# Patient Record
Sex: Female | Born: 1975 | Hispanic: Yes | Marital: Married | Smoking: Never smoker
Health system: Southern US, Community
[De-identification: ages and names within clinical notes are randomized; demographics above are authoritative.]

## PROBLEM LIST (undated history)

## (undated) DIAGNOSIS — N83209 Unspecified ovarian cyst, unspecified side: Secondary | ICD-10-CM

## (undated) DIAGNOSIS — J45909 Unspecified asthma, uncomplicated: Secondary | ICD-10-CM

## (undated) DIAGNOSIS — E785 Hyperlipidemia, unspecified: Secondary | ICD-10-CM

## (undated) DIAGNOSIS — T7840XA Allergy, unspecified, initial encounter: Secondary | ICD-10-CM

## (undated) HISTORY — PX: BREAST SURGERY: SHX581

## (undated) HISTORY — DX: Hyperlipidemia, unspecified: E78.5

## (undated) HISTORY — PX: BREAST EXCISIONAL BIOPSY: SUR124

## (undated) HISTORY — DX: Allergy, unspecified, initial encounter: T78.40XA

## (undated) HISTORY — DX: Unspecified asthma, uncomplicated: J45.909

---

## 2008-03-26 HISTORY — PX: ABDOMINAL HYSTERECTOMY: SHX81

## 2020-08-01 ENCOUNTER — Ambulatory Visit: Payer: Self-pay | Admitting: Internal Medicine

## 2020-08-16 ENCOUNTER — Telehealth: Payer: Self-pay | Admitting: Family Medicine

## 2020-08-16 NOTE — Telephone Encounter (Signed)
Interpreter Raquel called patient to tell about appointment change, she left a message and asked the patient to call back

## 2020-08-17 ENCOUNTER — Encounter: Payer: Self-pay | Admitting: Family Medicine

## 2020-08-18 ENCOUNTER — Emergency Department (HOSPITAL_COMMUNITY)
Admission: EM | Admit: 2020-08-18 | Discharge: 2020-08-18 | Disposition: A | Discharge status: Home / Self Care | Payer: Self-pay | Attending: Emergency Medicine | Admitting: Emergency Medicine

## 2020-08-18 ENCOUNTER — Emergency Department (HOSPITAL_COMMUNITY): Payer: Self-pay

## 2020-08-18 ENCOUNTER — Encounter (HOSPITAL_COMMUNITY): Payer: Self-pay | Admitting: Emergency Medicine

## 2020-08-18 DIAGNOSIS — N83209 Unspecified ovarian cyst, unspecified side: Secondary | ICD-10-CM

## 2020-08-18 DIAGNOSIS — N83202 Unspecified ovarian cyst, left side: Secondary | ICD-10-CM | POA: Insufficient documentation

## 2020-08-18 HISTORY — DX: Unspecified ovarian cyst, unspecified side: N83.209

## 2020-08-18 MED ORDER — NORGESTIMATE-ETH ESTRADIOL 0.25-35 MG-MCG PO TABS: 1.0000 | ORAL_TABLET | Freq: Every day | ORAL | 1 refills | Status: DC

## 2020-08-18 NOTE — ED Provider Notes (Signed)
[facility] EMERGENCY DEPARTMENT Provider Note   CSN: [id number] Arrival date & time: 08/18/20  1023     History No chief complaint on file.   [name] is a 45 y.o. female.  HPI    45 year old female comes in a chief complaint of ovarian cyst. Patient is new to the country.  When she was in [place], she was noted to have ovarian cyst.  She was started on birth control pill.  She was advised to follow-up with a gynecologist here.  She had an appointment on 5-25 which fell through for some reason.  She comes to the ER because she is running out of her oral contraceptives and really needs to know if her cyst is responding to the OCPs she was taking.  She has mild discomfort but currently is comfortable and denies any bleeding.  Translation service was utilized for this visit. Past Medical History:  Diagnosis Date  . Ovarian cyst     There are no problems to display for this patient.    OB History   No obstetric history on file.     No family history on file.  Social History   Tobacco Use  . Smoking status: Never Smoker  Substance Use Topics  . Alcohol use: Not Currently  . Drug use: Never    Home Medications Prior to Admission medications   Medication Sig Start Date End Date Taking? Authorizing Provider  CRANBERRY PO Take 2 tablets by mouth daily.   Yes [provider]  esomeprazole (NEXIUM) 20 MG capsule Take 20 mg by mouth daily.   Yes [provider]  fosfomycin (MONUROL) 3 g PACK Take 3 g by mouth every 3 (three) days. 3 packs of 3g each.   Yes [provider]  Probiotic Product (PROBIOTIC PO) Take 1 capsule by mouth daily.   Yes [provider]  sertraline (ZOLOFT) 50 MG tablet Take 50 mg by mouth daily.   Yes [provider]  norgestimate-ethinyl estradiol (ORTHO-CYCLEN, 28,) 0.25-35 MG-MCG tablet Take 1 tablet by mouth daily. 08/18/20  Yes Derwood Kaplan, MD     Allergies    Epinephrine and Orphenadrine  Review of Systems   Review of Systems  Constitutional: Negative for activity change.  Gastrointestinal: Negative for abdominal pain.  Genitourinary: Positive for pelvic pain. Negative for vaginal bleeding and vaginal discharge.    Physical Exam Updated Vital Signs BP 126/79   Pulse 81   Temp 98.1 F (36.7 C) (Oral)   Resp 16   SpO2 100%   Physical Exam Vitals and nursing note reviewed.  Constitutional:      Appearance: She is well-developed.  HENT:     Head: Normocephalic and atraumatic.  Cardiovascular:     Rate and Rhythm: Normal rate.  Pulmonary:     Effort: Pulmonary effort is normal.  Abdominal:     General: Bowel sounds are normal.  Musculoskeletal:     Cervical back: Normal range of motion and neck supple.  Skin:    General: Skin is warm and dry.  Neurological:     Mental Status: She is alert and oriented to person, place, and time.     ED Results / Procedures / Treatments   Labs (all labs ordered are listed, but only abnormal results are displayed) Labs Reviewed - No data to display  EKG None  Radiology US PELVIC COMPLETE W TRANSVAGINAL AND TORSION R/O  Result Date: 08/18/2020 CLINICAL DATA:  Pelvic pain.  Left ovarian cystic lesion. EXAM: TRANSABDOMINAL AND TRANSVAGINAL ULTRASOUND OF PELVIS DOPPLER ULTRASOUND OF OVARIES TECHNIQUE: Both transabdominal and transvaginal ultrasound examinations of the pelvis were performed. Transabdominal technique was performed for global imaging of the pelvis including uterus, ovaries, adnexal regions, and pelvic cul-de-sac. It was necessary to proceed with endovaginal exam following the transabdominal exam to visualize the ovaries. Color and duplex Doppler ultrasound was utilized to evaluate blood flow to the ovaries. COMPARISON:  None. FINDINGS: Uterus Measurements: Surgically absent. Vaginal cuff is unremarkable in appearance. Endometrium Thickness: Surgically absent. Right  ovary Measurements: Not visualized, however no adnexal mass identified. Left ovary Measurements: 6.9 x 5.4 x 5.4 cm = volume: 107 mL. No normal left ovary is seen. A cystic lesion is seen in the left adnexa which shows a single smooth but thickened septation measuring approximately 5 mm in thickness. This lesion measures 5.8 x 5.0 x 5.1 cm. Pulsed Doppler evaluation of right ovary could not be performed because it was not visualized. Pulsed Doppler evaluation of the left adnexal lesion demonstrates arterial and venous waveforms within the peripheral rim and internal septation of this lesion, which is presumably ovarian in origin. Other findings No abnormal free fluid. IMPRESSION: 5.8 cm complex cystic lesion in left adnexa, with indeterminate but probably benign characteristics. Cystic ovarian neoplasm cannot be excluded. Consider correlation with tumor markers, and short-term follow-up with ultrasound or pelvic MRI without and with contrast in 6 weeks. Blood flow is seen within this left adnexal lesion, which is presumably ovarian in origin. Nonvisualization of right ovary, however no right adnexal mass identified. Prior hysterectomy. Electronically Signed   By: Danae Orleans M.D.   On: 08/18/2020 14:21    Procedures Procedures   Medications Ordered in ED Medications - No data to display  ED Course  I have reviewed the triage vital signs and the nursing notes.  Pertinent labs & imaging results that were available during my care of the patient were reviewed by me and considered in my medical decision making (see chart for details).    MDM Rules/Calculators/A&P                          Patient comes in with chief complaint of ovarian cyst.  It appears that something happened with an appointment yesterday, and patient is running out of her OCPs that were prescribed for her ovarian cyst.  She is not having any current pain or bleeding.  She needs to know if she needs to continue taking OCPs and she  needs prescription for it.  Ultrasound ordered.  She does have a cyst that is about 5 cm big.  Slightly lower than the cyst size at the very beginning.  Spoke with faculty on-call.  They recommended continuation of OCPs -and as per Dr. Crissie Reese, Ortho-Cyclen has been prescribed.  Results discussed with the patient.  Confirmed pharmacy location and also next appointment on July 6.  Final Clinical Impression(s) / ED Diagnoses Final diagnoses:  Cyst of left ovary    Rx / DC Orders ED Discharge Orders         Ordered    norgestimate-ethinyl estradiol (ORTHO-CYCLEN, 28,) 0.25-35 MG-MCG tablet  Daily        08/18/20 1448           Derwood Kaplan, MD 08/18/20 1457

## 2020-08-18 NOTE — ED Triage Notes (Signed)
Pt reports she has left ovarian cyst. Pt reports has new patient appt in Oct. Requesting ultrasound. Denies pain, nausea, vomiting, fever.

## 2020-08-18 NOTE — ED Notes (Signed)
Pt discharged by EDP. Pt verbalizes understanding of d/c instructions. Prescriptions reviewed with patient. Pt ambulatory at d/c with all belongings and with family.

## 2020-08-18 NOTE — Progress Notes (Signed)
Spoke with Dr. Rhunette Croft regarding this patient, hx of ovarian mass and placed on OCP's. Was to have visit yesterday with [facility] but this was rescheduled to July. OK to wait until this visit for evaluation by OBGYN, recommend continued treatment with OCP's until that visit.

## 2020-08-18 NOTE — Discharge Instructions (Addendum)
Your cyst on the left side measures: 5.8 x 5.0 x 5.1 cm.   Take the medicine prescribed. You have an [facility] in [facility] for [facility].

## 2020-08-18 NOTE — ED Notes (Signed)
Patient transported to Ultrasound 

## 2020-08-18 NOTE — ED Notes (Signed)
Will update pt vitals upon return from ultrasound

## 2020-09-28 ENCOUNTER — Encounter: Payer: Self-pay | Admitting: Obstetrics & Gynecology

## 2020-12-26 ENCOUNTER — Other Ambulatory Visit: Payer: Self-pay | Admitting: Obstetrics and Gynecology

## 2020-12-26 ENCOUNTER — Other Ambulatory Visit: Payer: Self-pay

## 2020-12-26 DIAGNOSIS — Z1231 Encounter for screening mammogram for malignant neoplasm of breast: Secondary | ICD-10-CM

## 2021-01-24 ENCOUNTER — Ambulatory Visit: Payer: Self-pay

## 2021-02-21 ENCOUNTER — Ambulatory Visit: Payer: Self-pay

## 2021-02-23 ENCOUNTER — Other Ambulatory Visit: Payer: Self-pay

## 2021-02-23 ENCOUNTER — Ambulatory Visit: Payer: Self-pay | Admitting: *Deleted

## 2021-02-23 ENCOUNTER — Encounter (INDEPENDENT_AMBULATORY_CARE_PROVIDER_SITE_OTHER): Payer: Self-pay

## 2021-02-23 ENCOUNTER — Ambulatory Visit
Admission: RE | Admit: 2021-02-23 | Discharge: 2021-02-23 | Disposition: A | Discharge status: Home / Self Care | Payer: No Typology Code available for payment source | Source: Ambulatory Visit | Attending: Obstetrics and Gynecology | Admitting: Obstetrics and Gynecology

## 2021-02-23 VITALS — BP 118/72 | Wt 196.4 lb

## 2021-02-23 DIAGNOSIS — Z1239 Encounter for other screening for malignant neoplasm of breast: Secondary | ICD-10-CM

## 2021-02-23 DIAGNOSIS — Z1211 Encounter for screening for malignant neoplasm of colon: Secondary | ICD-10-CM

## 2021-02-23 DIAGNOSIS — Z1231 Encounter for screening mammogram for malignant neoplasm of breast: Secondary | ICD-10-CM

## 2021-02-23 NOTE — Patient Instructions (Signed)
Explained breast self awareness with Kristen Crawford. Patient did not need a Pap smear today due to patient has a history of a hysterectomy for benign reasons. Let patient know that she doesn't need any further Pap smears due to her history of a hysterectomy for benign reasons. Referred patient to the [facility] for a screening mammogram on mobile unit. Appointment scheduled Thursday, February 23, 2021 at 1000. Patient escorted to the mobile unit following BCCCP appointment for her screening mammogram. Let patient know the Breast Center will follow up with her within the next couple weeks with results of her mammogram by letter or phone. [name] [name] verbalized understanding.  [name], [name], RN 10:44 AM

## 2021-02-23 NOTE — Progress Notes (Signed)
Kristen Crawford Kristen Crawford is a 45 y.o. female who presents to [facility] clinic today with complaint of left ovary cysts. Let patient know that BCCCP doesn't cover follow-up for ovarian cysts. Patient requested a referral to an OBGYN for follow up. Sent referral to the [facility] for [facility]. Patient given the [facility] Assistance Application.    Pap Smear: Pap smear not completed today. Last Pap smear was in 2021 at a clinic in [place] and was normal per patient. Per patient has no history of an abnormal Pap smear. Patient has a history of a hysterectomy in 2012 due to fibroids. Patient states she only has her ovaries. Patient doesn't need any further Pap smears due to her history of a hysterectomy for benign reasons per BCCCP and ASCCP guidelines. Last Pap smear result is not available in Epic.   Physical exam: Breasts Breasts symmetrical. No skin abnormalities right breast. Observed two scars on left breast at the upper inner and above nipple. 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 No nipple retraction bilateral breasts. No nipple discharge bilateral breasts. No lymphadenopathy. No lumps palpated bilateral breasts. No complaints of pain or tenderness on exam.      Pelvic/Bimanual Pap is not indicated today per BCCCP guidelines.   Smoking History: Patient has never smoked.   Patient Navigation: Patient education provided. Access to services provided for patient through [place] program. Spanish interpreter Natale Lay from [facility] provided. Patient has food insecurities. Patient escorted to the food market at the [employer] for groceries.  Colorectal Cancer Screening: Per patient has never had colonoscopy completed. FIT Test given to patient to complete. No complaints today.    Breast and Cervical Cancer Risk Assessment: Patient does not have family history of breast cancer, known genetic  mutations, or radiation treatment to the chest before age 29. Patient does not have history of cervical dysplasia, immunocompromised, or DES exposure in-utero.  Risk Assessment     Risk Scores       02/23/2021   Last edited by: Narda Rutherford, LPN   5-year risk: 1.7 %   Lifetime risk: 11.8 %            A: BCCCP exam without pap smear No breast complaints.  P: Referred patient to the [facility] for a screening mammogram on mobile unit. Appointment scheduled Thursday, February 23, 2021 at 1000.  Priscille Heidelberg, RN 02/23/2021 10:43 AM

## 2021-02-27 ENCOUNTER — Other Ambulatory Visit: Payer: Self-pay | Admitting: Obstetrics and Gynecology

## 2021-02-27 DIAGNOSIS — R928 Other abnormal and inconclusive findings on diagnostic imaging of breast: Secondary | ICD-10-CM

## 2021-03-04 LAB — FECAL OCCULT BLOOD, IMMUNOCHEMICAL: Fecal Occult Bld: NEGATIVE

## 2021-03-06 ENCOUNTER — Telehealth: Payer: Self-pay

## 2021-03-06 NOTE — Telephone Encounter (Signed)
Via Delorise Royals, Spanish Interpreter, Patient informed negative FIT Test results. Patient verbalized understanding.

## 2021-04-04 ENCOUNTER — Ambulatory Visit
Admission: RE | Admit: 2021-04-04 | Discharge: 2021-04-04 | Disposition: A | Discharge status: Home / Self Care | Payer: No Typology Code available for payment source | Source: Ambulatory Visit | Attending: Obstetrics and Gynecology | Admitting: Obstetrics and Gynecology

## 2021-04-04 ENCOUNTER — Other Ambulatory Visit: Payer: Self-pay | Admitting: Obstetrics and Gynecology

## 2021-04-04 DIAGNOSIS — R928 Other abnormal and inconclusive findings on diagnostic imaging of breast: Secondary | ICD-10-CM

## 2021-05-17 ENCOUNTER — Encounter: Payer: Self-pay | Admitting: Obstetrics & Gynecology

## 2021-05-17 ENCOUNTER — Other Ambulatory Visit: Payer: Self-pay

## 2021-05-17 ENCOUNTER — Ambulatory Visit (INDEPENDENT_AMBULATORY_CARE_PROVIDER_SITE_OTHER): Payer: Self-pay | Admitting: Obstetrics & Gynecology

## 2021-05-17 VITALS — BP 122/81 | HR 78 | Wt 196.6 lb

## 2021-05-17 DIAGNOSIS — N83202 Unspecified ovarian cyst, left side: Secondary | ICD-10-CM

## 2021-05-17 MED ORDER — FLUCONAZOLE 150 MG PO TABS: 150.0000 mg | ORAL_TABLET | Freq: Once | ORAL | 0 refills | Status: AC

## 2021-05-17 NOTE — Progress Notes (Signed)
Patient ID: Kristen Crawford, female   DOB: 05-11-1975, 46 y.o.   MRN: [id number]  Chief Complaint  Patient presents with   Ovarian Cyst    HPI Kristen Crawford is a 46 y.o. female.  No obstetric history on file. No LMP recorded. Patient has had a hysterectomy. TAH done 10 years ago. She says she had endometriosis. She comes today with h/o left ovarian cyst that has been seen on [place]. Her last [place] was done recently in [place] and she says the mass had reduced. No c/o pain. She stopped OcP that were prescribed due to the cyst. HPI  Past Medical History:  Diagnosis Date   Allergy    Asthma    Hyperlipidemia    Ovarian cyst     Past Surgical History:  Procedure Laterality Date   ABDOMINAL HYSTERECTOMY  2010   BREAST EXCISIONAL BIOPSY Left    fibroadenoma ( years after first BX)   BREAST EXCISIONAL BIOPSY Left    in her 73s - fibroadenoma   BREAST SURGERY      Family History  Problem Relation Age of Onset   Hypertension Father    Testicular cancer Brother     Social History Social History   Tobacco Use   Smoking status: Never   Smokeless tobacco: Never  Vaping Use   Vaping Use: Never used  Substance Use Topics   Alcohol use: Not Currently   Drug use: Never    Allergies  Allergen Reactions   Epinephrine Anaphylaxis    Pt passes out   Orphenadrine Anaphylaxis    Pt passes out    Current Outpatient Medications  Medication Sig Dispense Refill   atorvastatin (LIPITOR) 10 MG tablet Take 10 mg by mouth daily.     CRANBERRY PO Take 2 tablets by mouth daily.     esomeprazole (NEXIUM) 20 MG capsule Take 20 mg by mouth daily.     fluconazole (DIFLUCAN) 150 MG tablet Take 1 tablet (150 mg total) by mouth once for 1 dose. 1 tablet 0   fosfomycin (MONUROL) 3 g PACK Take 3 g by mouth every 3 (three) days. 3 packs of 3g each.     norgestimate-ethinyl estradiol (ORTHO-CYCLEN, 28,) 0.25-35 MG-MCG tablet Take 1 tablet by mouth daily. 60 tablet 1    Probiotic Product (PROBIOTIC PO) Take 1 capsule by mouth daily.     sertraline (ZOLOFT) 50 MG tablet Take 50 mg by mouth daily.     No current facility-administered medications for this visit.    Review of Systems Review of Systems  Constitutional: Negative.   Respiratory: Negative.    Gastrointestinal: Negative.   Genitourinary:  Positive for frequency. Negative for pelvic pain and vaginal bleeding. Vaginal discharge: h/o vaginal yeast no sx today.  Blood pressure 122/81, pulse 78, weight 196 lb 9.6 oz (89.2 kg).  Physical Exam Physical Exam Vitals and nursing note reviewed. Exam conducted with a chaperone present.  Constitutional:      Appearance: Normal appearance.  Genitourinary:    General: Normal vulva.     Exam position: Lithotomy position.     Vagina: Vaginal discharge: mild white d/c not c/w yeast, cuff intact.     Uterus: Absent.      Adnexa: Right adnexa normal and left adnexa normal.  Neurological:     General: No focal deficit present.     Mental Status: She is alert.  Psychiatric:        Mood and Affect: Mood normal.  Behavior: Behavior normal.    Data Reviewed CLINICAL DATA:  Pelvic pain.  Left ovarian cystic lesion.   EXAM: TRANSABDOMINAL AND TRANSVAGINAL ULTRASOUND OF PELVIS   DOPPLER ULTRASOUND OF OVARIES   TECHNIQUE: Both transabdominal and transvaginal ultrasound examinations of the pelvis were performed. Transabdominal technique was performed for global imaging of the pelvis including uterus, ovaries, adnexal regions, and pelvic cul-de-sac.   It was necessary to proceed with endovaginal exam following the transabdominal exam to visualize the ovaries. Color and duplex Doppler ultrasound was utilized to evaluate blood flow to the ovaries.   COMPARISON:  None.   FINDINGS: Uterus   Measurements: Surgically absent. Vaginal cuff is unremarkable in appearance.   Endometrium   Thickness: Surgically absent.   Right ovary    Measurements: Not visualized, however no adnexal mass identified.   Left ovary   Measurements: 6.9 x 5.4 x 5.4 cm = volume: 107 mL. No normal left ovary is seen. A cystic lesion is seen in the left adnexa which shows a single smooth but thickened septation measuring approximately 5 mm in thickness. This lesion measures 5.8 x 5.0 x 5.1 cm.   Pulsed Doppler evaluation of right ovary could not be performed because it was not visualized. Pulsed Doppler evaluation of the left adnexal lesion demonstrates arterial and venous waveforms within the peripheral rim and internal septation of this lesion, which is presumably ovarian in origin.   Other findings   No abnormal free fluid.   IMPRESSION: 5.8 cm complex cystic lesion in left adnexa, with indeterminate but probably benign characteristics. Cystic ovarian neoplasm cannot be excluded. Consider correlation with tumor markers, and short-term follow-up with ultrasound or pelvic MRI without and with contrast in 6 weeks.   Blood flow is seen within this left adnexal lesion, which is presumably ovarian in origin.   Nonvisualization of right ovary, however no right adnexal mass identified.   Prior hysterectomy.     Electronically Signed   By: Danae Orleans M.D.   On: 08/18/2020 14:21  Assessment Cyst of left ovary - Plan: US PELVIC COMPLETE WITH TRANSVAGINAL, fluconazole (DIFLUCAN) 150 MG tablet   Plan She requested diflucan Rx. F/u after [place]     Scheryl Darter 05/17/2021, 9:48 AM

## 2021-05-22 ENCOUNTER — Telehealth: Payer: Self-pay

## 2021-05-23 ENCOUNTER — Ambulatory Visit: Admission: RE | Admit: 2021-05-23 | Payer: No Typology Code available for payment source | Source: Ambulatory Visit

## 2021-05-23 ENCOUNTER — Other Ambulatory Visit: Payer: Self-pay

## 2021-05-23 ENCOUNTER — Ambulatory Visit
Admission: RE | Admit: 2021-05-23 | Discharge: 2021-05-23 | Disposition: A | Discharge status: Home / Self Care | Payer: No Typology Code available for payment source | Source: Ambulatory Visit | Attending: Obstetrics & Gynecology | Admitting: Obstetrics & Gynecology

## 2021-05-23 DIAGNOSIS — N83202 Unspecified ovarian cyst, left side: Secondary | ICD-10-CM | POA: Insufficient documentation

## 2021-05-30 ENCOUNTER — Telehealth: Payer: Self-pay | Admitting: General Practice

## 2021-05-30 NOTE — Telephone Encounter (Signed)
Patient called into front office requesting a call back regarding her ultrasound results. Patient states she has been calling for days and no one has called her back.  ? ?Reviewed results with Dr Debroah Loop who states recommend office f/u as this may be endometriosis and she could be a candidate to have the ovary removed. Called patient with Raquel assisting with spanish interpretation and reviewed results and recommendations with patient. Informed patient of follow up appt scheduled 3/30. Patient verbalized understanding and asked if she could get a copy of the report. Told patient she is welcome to come get a copy of the report anytime we are open or she can access the report in mychart account. Patient verbalized understanding to all. ?

## 2021-06-22 ENCOUNTER — Ambulatory Visit: Payer: No Typology Code available for payment source | Admitting: Obstetrics & Gynecology

## 2021-10-05 ENCOUNTER — Ambulatory Visit
Admission: RE | Admit: 2021-10-05 | Discharge: 2021-10-05 | Disposition: A | Discharge status: Home / Self Care | Payer: No Typology Code available for payment source | Source: Ambulatory Visit | Attending: Obstetrics and Gynecology | Admitting: Obstetrics and Gynecology

## 2021-10-05 DIAGNOSIS — R928 Other abnormal and inconclusive findings on diagnostic imaging of breast: Secondary | ICD-10-CM

## 2022-02-06 ENCOUNTER — Other Ambulatory Visit (HOSPITAL_BASED_OUTPATIENT_CLINIC_OR_DEPARTMENT_OTHER): Payer: Self-pay

## 2022-02-06 ENCOUNTER — Other Ambulatory Visit: Payer: Self-pay

## 2022-02-06 MED ORDER — SAXENDA 18 MG/3ML ~~LOC~~ SOPN
PEN_INJECTOR | SUBCUTANEOUS | 11 refills | Status: AC
  Filled 2022-02-06: qty 15, 30d supply, fill #0
  Filled 2022-02-06: qty 15, fill #0

## 2022-02-14 ENCOUNTER — Other Ambulatory Visit (HOSPITAL_BASED_OUTPATIENT_CLINIC_OR_DEPARTMENT_OTHER): Payer: Self-pay

## 2022-02-14 ENCOUNTER — Other Ambulatory Visit: Payer: Self-pay

## 2022-02-14 MED ORDER — OZEMPIC (0.25 OR 0.5 MG/DOSE) 2 MG/3ML ~~LOC~~ SOPN: PEN_INJECTOR | SUBCUTANEOUS | 2 refills | Status: AC

## 2022-03-08 ENCOUNTER — Other Ambulatory Visit: Payer: Self-pay

## 2022-03-08 DIAGNOSIS — N63 Unspecified lump in unspecified breast: Secondary | ICD-10-CM

## 2022-04-18 ENCOUNTER — Other Ambulatory Visit: Payer: Self-pay | Admitting: Obstetrics and Gynecology

## 2022-04-18 ENCOUNTER — Ambulatory Visit
Admission: RE | Admit: 2022-04-18 | Discharge: 2022-04-18 | Disposition: A | Discharge status: Home / Self Care | Payer: No Typology Code available for payment source | Source: Ambulatory Visit | Attending: Obstetrics and Gynecology | Admitting: Obstetrics and Gynecology

## 2022-04-18 ENCOUNTER — Encounter: Payer: Self-pay | Admitting: Obstetrics and Gynecology

## 2022-04-18 DIAGNOSIS — R7612 Nonspecific reaction to cell mediated immunity measurement of gamma interferon antigen response without active tuberculosis: Secondary | ICD-10-CM

## 2022-05-14 ENCOUNTER — Telehealth: Payer: Self-pay

## 2022-05-14 NOTE — Telephone Encounter (Signed)
This is information was gather using [place], [place]: [phone].

## 2022-05-14 NOTE — Telephone Encounter (Signed)
This pt called this morning requesting her BCCCP and diagnostic mammo be moved to 06/18/22 if possible. Pt states if this is not possible, please leave her appt for tomorrow.  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

## 2022-05-14 NOTE — Telephone Encounter (Signed)
Pt has called back and wants to keep her appts as they currently are. She understands to come to BCCCP at 12:30PM and her [facility] appt is at 2:00PM.

## 2022-05-15 ENCOUNTER — Other Ambulatory Visit: Payer: Self-pay

## 2022-05-15 ENCOUNTER — Ambulatory Visit: Payer: Self-pay | Admitting: *Deleted

## 2022-05-15 VITALS — BP 115/70 | Ht 66.93 in | Wt 202.0 lb

## 2022-05-15 DIAGNOSIS — Z1211 Encounter for screening for malignant neoplasm of colon: Secondary | ICD-10-CM

## 2022-05-15 DIAGNOSIS — Z1239 Encounter for other screening for malignant neoplasm of breast: Secondary | ICD-10-CM

## 2022-05-15 NOTE — Patient Instructions (Addendum)
Explained breast self awareness with Terressa Koyanagi. Patient did not need a Pap smear today due to patient has a history of a hysterectomy for benign reasons. Let patient know that she doesn't need any further Pap smears due to her history of a hysterectomy for benign reasons. Referred patient to the [facility] for a diagnostic mammogram per recommendation. Appointment scheduled Monday, June 18, 2022 at 1410. [place] verbalized understanding.  Omauri Boeve, Arvil Chaco, RN 12:54 PM

## 2022-05-15 NOTE — Progress Notes (Addendum)
[name] is a 47 y.o. female who presents to [facility] clinic today with no complaints. Patient had a diagnostic mammogram completed 10/05/2021 that 38-monthbilateral diagnostic mammogram is recommended for follow up.   Pap Smear: Pap smear not completed today. [place]. Per patient has no history of an abnormal Pap smear. Patient has a history of a hysterectomy in 2012 due to fibroids. Patient states she only has her ovaries. Patient doesn't need any further Pap smears due to her history of a hysterectomy for benign reasons per BCCCP and ASCCP guidelines. Last Pap smear result is not available in Epic.    Physical exam: Breasts Right breast greater than left breast that per patient is normal for her. No skin abnormalities right breast. Observed two scars on left breast at the upper inner and above nipple. 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 No nipple retraction bilateral breasts. No nipple discharge bilateral breasts. No lymphadenopathy. No lumps palpated bilateral breasts. No complaints of pain or tenderness on exam.      MM DIAG BREAST TOMO BILATERAL  Result Date: 10/05/2021 CLINICAL DATA:  Follow-up probably benign bilateral breast masses. EXAM: DIGITAL DIAGNOSTIC BILATERAL MAMMOGRAM WITH TOMOSYNTHESIS AND CAD; ULTRASOUND LEFT BREAST LIMITED; ULTRASOUND RIGHT BREAST LIMITED TECHNIQUE: Bilateral digital diagnostic mammography and breast tomosynthesis was performed. The images were evaluated with computer-aided detection.; Targeted ultrasound examination of the left breast was performed.; Targeted ultrasound examination of the right breast was performed COMPARISON:  Previous exam(s). ACR Breast Density Category c: The breast tissue is heterogeneously dense, which may obscure small masses. FINDINGS: The mass in the medial right breast is stable mammographically.  No other suspicious masses, calcifications, or distortion are identified in either breast. Postsurgical changes remain on the left. Targeted ultrasound is performed, showing stable masses in both breasts. The mass on the left measures 10 by 8 mm, less conspicuous on today's ultrasound. No significant change. The right-sided mass is likely a complicated cyst measuring 7 x 10 x 5 mm without significant interval change. IMPRESSION: Stable probably benign breast masses. The mass on the left is only seen sonographically. RECOMMENDATION: Recommend six-month follow-up mammogram and ultrasound of the probably benign masses. I have discussed the findings and recommendations with the patient. If applicable, a reminder letter will be sent to the patient regarding the next appointment. BI-RADS CATEGORY  3: Probably benign. Electronically Signed   By: DDorise BullionIII M.D.   On: 10/05/2021 15:17  MS DIGITAL DIAG TOMO BILAT  Result Date: 04/04/2021 CLINICAL DATA:  47year old female recalled from baseline screening mammogram dated 02/23/2021 for possible bilateral masses. EXAM: DIGITAL DIAGNOSTIC BILATERAL MAMMOGRAM WITH TOMOSYNTHESIS AND CAD; ULTRASOUND RIGHT BREAST LIMITED; ULTRASOUND LEFT BREAST LIMITED TECHNIQUE: Bilateral digital diagnostic mammography and breast tomosynthesis was performed. The images were evaluated with computer-aided detection.; Targeted ultrasound examination of the right breast was performed; Targeted ultrasound examination of the left breast was performed. COMPARISON:  Previous exam(s). ACR Breast Density Category c: The breast tissue is heterogeneously dense, which may obscure small masses. FINDINGS: There is a persistent oval, circumscribed equal density mass in the medial right breast. A partially circumscribed, partially obscured mass is seen in the deep central left breast. Further evaluation with ultrasound was performed. Targeted ultrasound is performed, showing an oval, circumscribed  hypoechoic mass at the deep 1 o'clock position 4 cm from the nipple on  the right. It measures 11 x 9 x 6 mm. This likely corresponds with the mammographic finding. A circumscribed mixed echogenicity mass is noted at the deep 4 o'clock position 4 cm from the nipple on the left. It measures 10 x 8 x 3 mm. This likely corresponds with the mammographic finding on the left. IMPRESSION: Probably benign bilateral breast masses. Recommend short-term imaging follow-up. RECOMMENDATION: Bilateral diagnostic mammogram and ultrasound in 6 months. I have discussed the findings and recommendations with the patient. If applicable, a reminder letter will be sent to the patient regarding the next appointment. BI-RADS CATEGORY  3: Probably benign. Electronically Signed   By: Kristopher Oppenheim M.D.   On: 04/04/2021 12:11  MM 3D SCREEN BREAST BILATERAL  Result Date: 02/24/2021 CLINICAL DATA:  Screening. EXAM: DIGITAL SCREENING BILATERAL MAMMOGRAM WITH TOMOSYNTHESIS AND CAD TECHNIQUE: Bilateral screening digital craniocaudal and mediolateral oblique mammograms were obtained. Bilateral screening digital breast tomosynthesis was performed. The images were evaluated with computer-aided detection. COMPARISON:  None. ACR Breast Density Category c: The breast tissue is heterogeneously dense, which may obscure small masses. FINDINGS: In the right breast 2 possible masses requires further evaluation. In the left breast possible mass requires further evaluation. IMPRESSION: Further evaluation is suggested for 2 possible masses in the right breast. Further evaluation is suggested for a possible mass in the left breast. RECOMMENDATION: Diagnostic mammogram and possibly ultrasound of both breasts. (Code:[id number]) The patient will be contacted regarding the findings, and additional imaging will be scheduled. BI-RADS CATEGORY  0: Incomplete. Need additional imaging evaluation and/or prior mammograms for comparison. Electronically Signed   By: Marin Olp M.D.   On: 02/24/2021 11:16    Pelvic/Bimanual Pap is not indicated today per BCCCP guidelines.    Smoking History: Patient has never smoked.    Patient Navigation: Patient education provided. Access to services provided for patient through [place] program. Spanish interpreter Rudene Anda from [facility] provided. Patient has food insecurities. Patient escorted to the food market at the [employer] for groceries.   Colorectal Cancer Screening: Per patient has never had colonoscopy completed. FIT Test given to patient to complete. No complaints today.    Breast and Cervical Cancer Risk Assessment: Patient does not have family history of breast cancer, known genetic mutations, or radiation treatment to the chest before age 65. Patient does not have history of cervical dysplasia, immunocompromised, or DES exposure in-utero.  Risk Scores as of 05/15/2022     [place]           5-year 1.47 %   Lifetime 10.7 %   This patient is [place] but has no documented birth country, so the [place] used data from [place] patients to calculate their risk score. Document a birth country in the Demographics activity for a more accurate score.         Last calculated by Claretha Cooper, CMA on 05/15/2022 at  1:02 PM       A: BCCCP exam without pap smear No complaints.  P: Referred patient to the [facility] for a diagnostic mammogram per recommendation. Appointment scheduled Monday, June 18, 2022 at 1410.  Loletta Parish, RN 05/15/2022 12:54 PM

## 2022-06-04 ENCOUNTER — Other Ambulatory Visit (HOSPITAL_COMMUNITY): Payer: Self-pay | Admitting: Family

## 2022-06-18 ENCOUNTER — Ambulatory Visit
Admission: RE | Admit: 2022-06-18 | Discharge: 2022-06-18 | Disposition: A | Discharge status: Home / Self Care | Payer: Self-pay | Source: Ambulatory Visit | Attending: Obstetrics and Gynecology | Admitting: Obstetrics and Gynecology

## 2022-06-18 ENCOUNTER — Other Ambulatory Visit: Payer: Self-pay

## 2022-06-18 ENCOUNTER — Ambulatory Visit
Admission: RE | Admit: 2022-06-18 | Discharge: 2022-06-18 | Disposition: A | Discharge status: Home / Self Care | Payer: No Typology Code available for payment source | Source: Ambulatory Visit | Attending: Obstetrics and Gynecology | Admitting: Obstetrics and Gynecology

## 2022-06-18 DIAGNOSIS — N63 Unspecified lump in unspecified breast: Secondary | ICD-10-CM

## 2022-06-20 LAB — FECAL OCCULT BLOOD, IMMUNOCHEMICAL: Fecal Occult Bld: NEGATIVE

## 2022-06-28 IMAGING — US US PELVIS COMPLETE TRANSABD/TRANSVAG W DUPLEX
1 series · 13 of 25 positions shown · non-contrast
Comparison: None.

CLINICAL DATA: Pelvic pain.  Left ovarian cystic lesion.

EXAM:
TRANSABDOMINAL AND TRANSVAGINAL ULTRASOUND OF PELVIS
DOPPLER ULTRASOUND OF OVARIES
TECHNIQUE: Both transabdominal and transvaginal ultrasound examinations of the
pelvis were performed. Transabdominal technique was performed for
global imaging of the pelvis including uterus, ovaries, adnexal
regions, and pelvic cul-de-sac.
It was necessary to proceed with endovaginal exam following the
transabdominal exam to visualize the ovaries. Color and duplex
Doppler ultrasound was utilized to evaluate blood flow to the
ovaries.

[Series 1: us pelvic complete w transvaginal and torsion righ · 91 acquisitions, 13 frames shown]
[im 1/91]
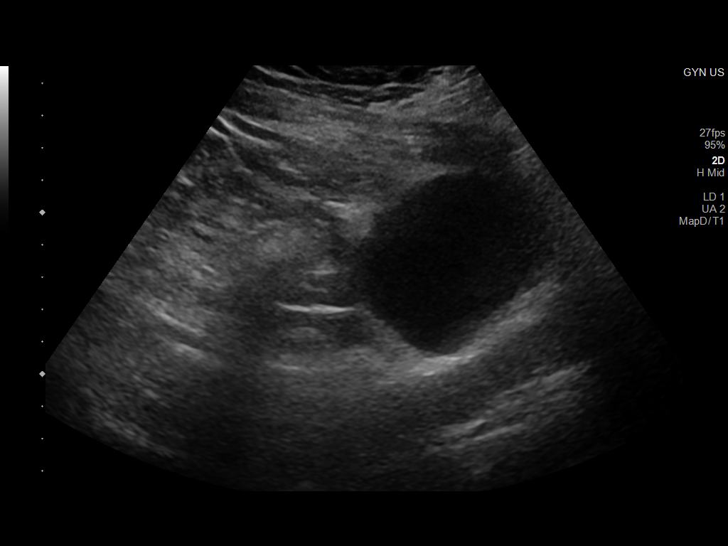
[im 8/91]
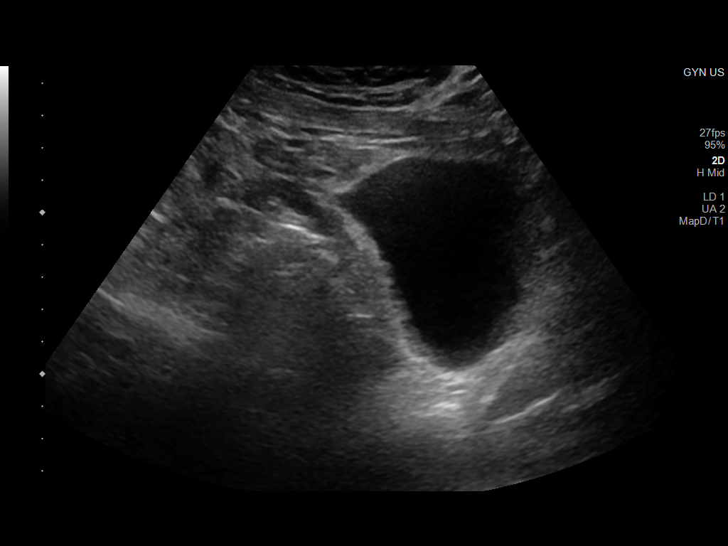
[im 16/91]
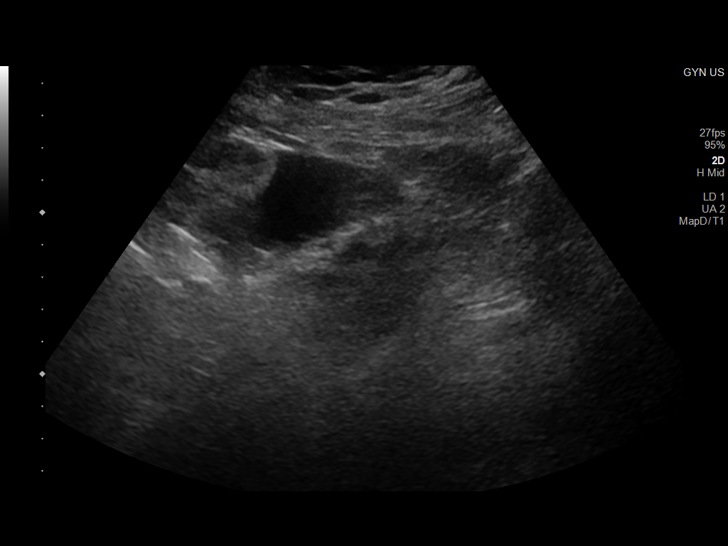
[im 23/91]
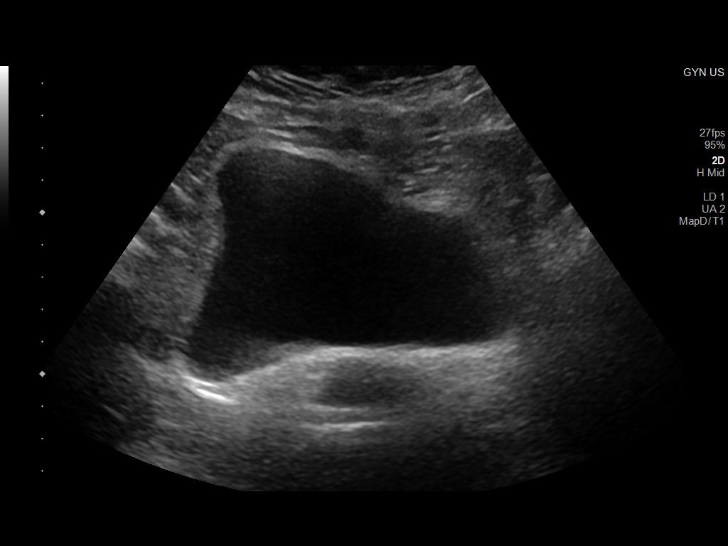
[im 31/91]
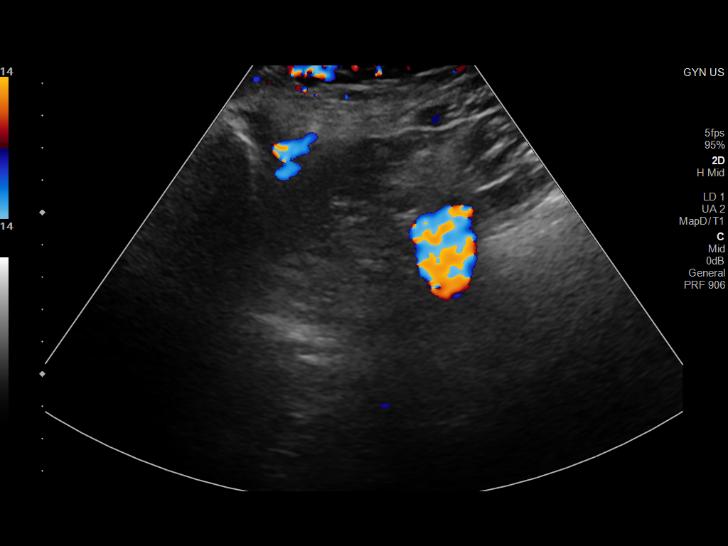
[im 38/91]
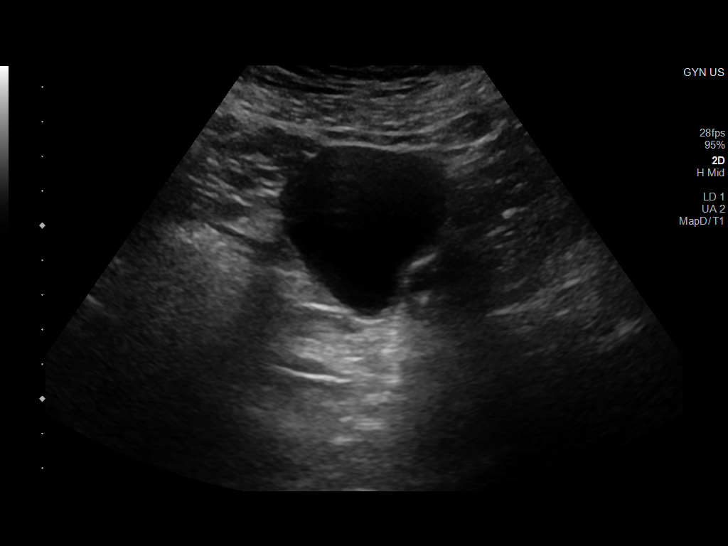
[im 46/91]
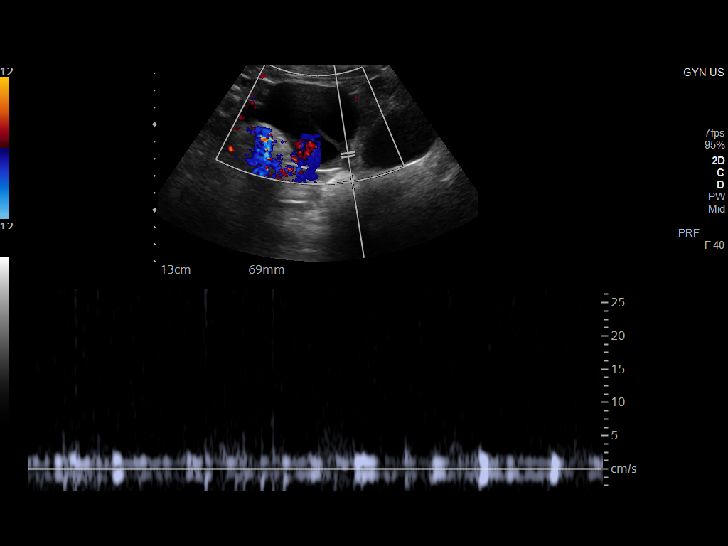
[im 53/91]
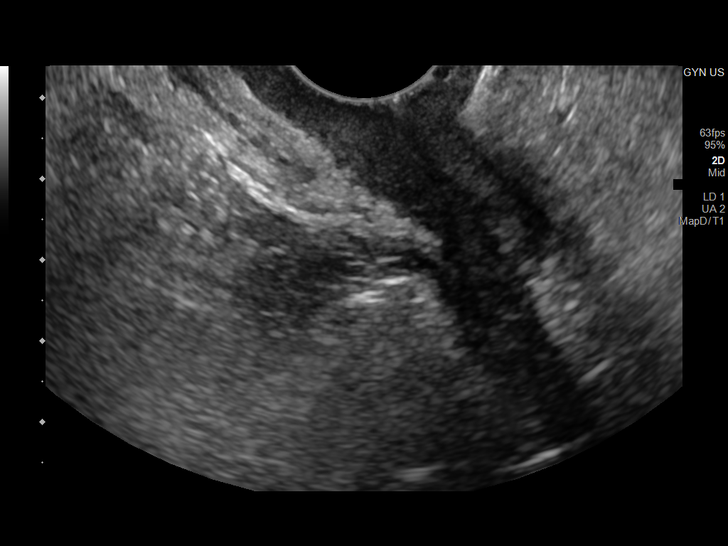
[im 61/91]
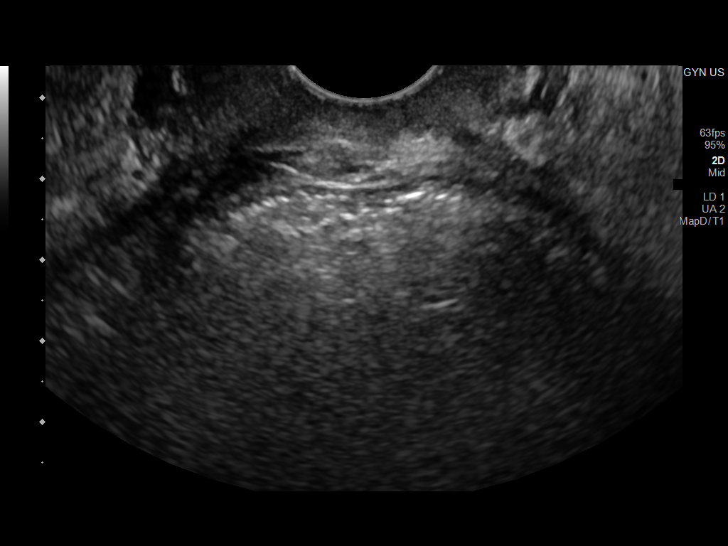
[im 68/91]
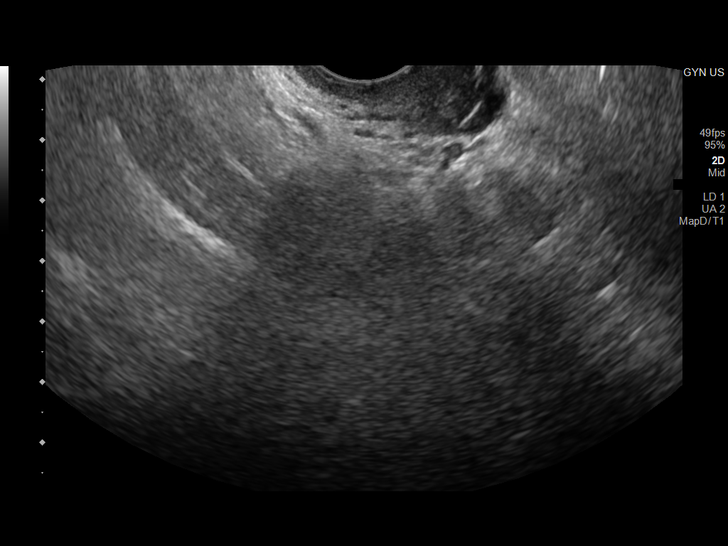
[im 76/91]
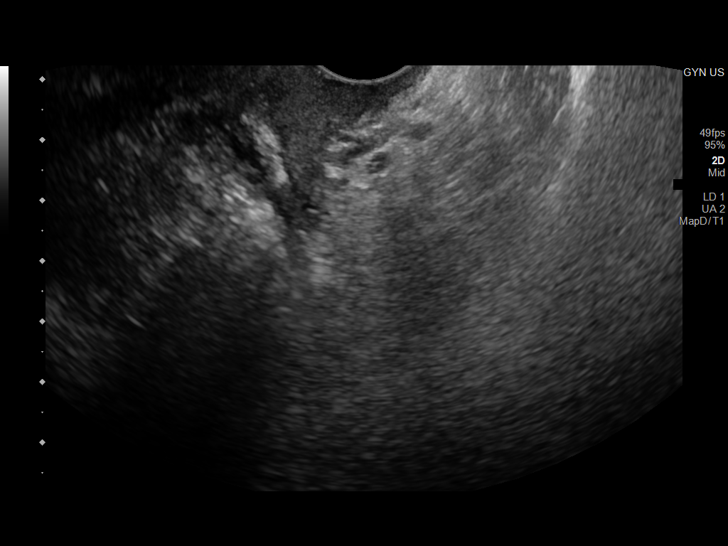
[im 83/91]
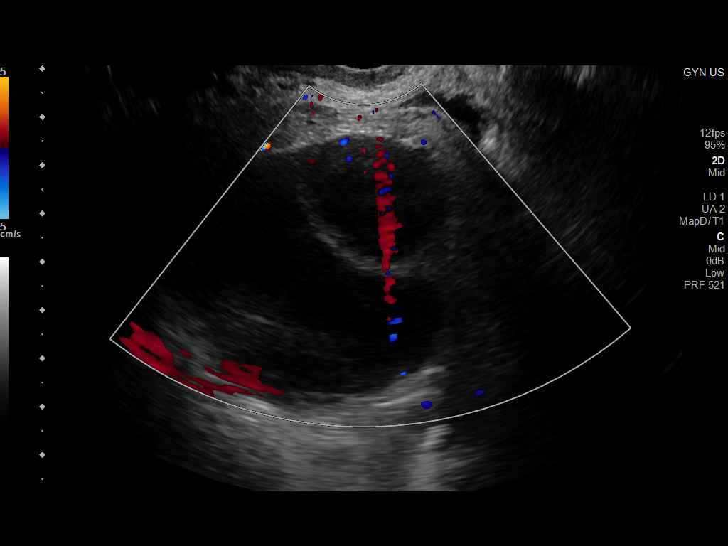
[im 91/91]
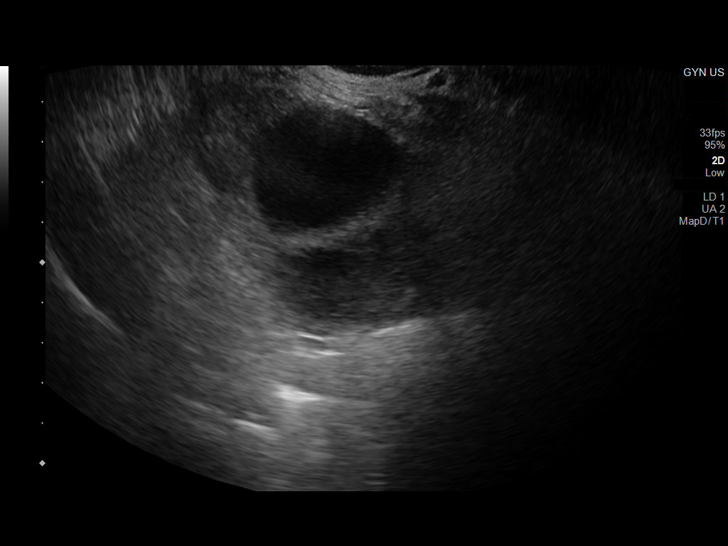

[13 of 25 positions shown; findings below may reference images not displayed]

FINDINGS: Uterus

Measurements: Surgically absent. Vaginal cuff is unremarkable in
appearance.

Endometrium

Thickness: Surgically absent.

Right ovary

Measurements: Not visualized, however no adnexal mass identified.

Left ovary

Measurements: 6.9 x 5.4 x 5.4 cm = volume: 107 mL. No normal left
ovary is seen. A cystic lesion is seen in the left adnexa which
shows a single smooth but thickened septation measuring
approximately 5 mm in thickness. This lesion measures 5.8 x 5.0 x
5.1 cm.

Pulsed Doppler evaluation of right ovary could not be performed
because it was not visualized. Pulsed Doppler evaluation of the left
adnexal lesion demonstrates arterial and venous waveforms within the
peripheral rim and internal septation of this lesion, which is
presumably ovarian in origin.

Other findings

No abnormal free fluid.
IMPRESSION: 5.8 cm complex cystic lesion in left adnexa, with indeterminate but
probably benign characteristics. Cystic ovarian neoplasm cannot be
excluded. Consider correlation with tumor markers, and short-term
follow-up with ultrasound or pelvic MRI without and with contrast in
6 weeks.

Blood flow is seen within this left adnexal lesion, which is
presumably ovarian in origin.

Nonvisualization of right ovary, however no right adnexal mass
identified.

Prior hysterectomy.

## 2023-03-12 ENCOUNTER — Other Ambulatory Visit: Payer: Self-pay | Admitting: Physician Assistant

## 2023-03-12 DIAGNOSIS — N63 Unspecified lump in unspecified breast: Secondary | ICD-10-CM

## 2023-05-07 NOTE — Progress Notes (Signed)
Patient now has [employer]. Patient stated that she will [facility] and [facility] to let [facility] know about her insurance

## 2023-05-27 ENCOUNTER — Emergency Department (HOSPITAL_COMMUNITY)

## 2023-05-27 ENCOUNTER — Other Ambulatory Visit: Payer: Self-pay

## 2023-05-27 ENCOUNTER — Encounter (HOSPITAL_COMMUNITY): Payer: Self-pay

## 2023-05-27 ENCOUNTER — Emergency Department (HOSPITAL_COMMUNITY)
Admission: EM | Admit: 2023-05-27 | Discharge: 2023-05-27 | Disposition: A | Discharge status: Home / Self Care | Attending: Emergency Medicine | Admitting: Emergency Medicine

## 2023-05-27 DIAGNOSIS — J45909 Unspecified asthma, uncomplicated: Secondary | ICD-10-CM | POA: Insufficient documentation

## 2023-05-27 DIAGNOSIS — N3001 Acute cystitis with hematuria: Secondary | ICD-10-CM | POA: Diagnosis not present

## 2023-05-27 DIAGNOSIS — N12 Tubulo-interstitial nephritis, not specified as acute or chronic: Secondary | ICD-10-CM

## 2023-05-27 DIAGNOSIS — R1030 Lower abdominal pain, unspecified: Secondary | ICD-10-CM | POA: Diagnosis present

## 2023-05-27 DIAGNOSIS — E278 Other specified disorders of adrenal gland: Secondary | ICD-10-CM

## 2023-05-27 LAB — URINALYSIS, ROUTINE W REFLEX MICROSCOPIC
Bilirubin Urine: NEGATIVE
Glucose, UA: >=500 mg/dL — AB
Ketones, ur: NEGATIVE mg/dL
Nitrite: POSITIVE — AB
Protein, ur: NEGATIVE mg/dL
Specific Gravity, Urine: 1.003 — ABNORMAL LOW (ref 1.005–1.030)
WBC, UA: >50 WBC/hpf (ref 0–5)
pH: 7 (ref 5.0–8.0)

## 2023-05-27 LAB — CBC
HCT: 46 % (ref 36.0–46.0)
Hemoglobin: 14.8 g/dL (ref 12.0–15.0)
MCH: 26.7 pg (ref 26.0–34.0)
MCHC: 32.2 g/dL (ref 30.0–36.0)
MCV: 83 fL (ref 80.0–100.0)
Platelets: 307 10*3/uL (ref 150–400)
RBC: 5.54 MIL/uL — ABNORMAL HIGH (ref 3.87–5.11)
RDW: 14.1 % (ref 11.5–15.5)
WBC: 8.6 10*3/uL (ref 4.0–10.5)
nRBC: 0 % (ref 0.0–0.2)

## 2023-05-27 LAB — COMPREHENSIVE METABOLIC PANEL
ALT: 17 U/L (ref 0–44)
AST: 22 U/L (ref 15–41)
Albumin: 4.1 g/dL (ref 3.5–5.0)
Alkaline Phosphatase: 82 U/L (ref 38–126)
Anion gap: 11 (ref 5–15)
BUN: 9 mg/dL (ref 6–20)
CO2: 27 mmol/L (ref 22–32)
Calcium: 9.9 mg/dL (ref 8.9–10.3)
Chloride: 98 mmol/L (ref 98–111)
Creatinine, Ser: 0.68 mg/dL (ref 0.44–1.00)
GFR, Estimated: >60 mL/min (ref >60)
Glucose, Bld: 90 mg/dL (ref 70–99)
Potassium: 3.5 mmol/L (ref 3.5–5.1)
Sodium: 136 mmol/L (ref 135–145)
Total Bilirubin: 0.9 mg/dL (ref 0.0–1.2)
Total Protein: 7.5 g/dL (ref 6.5–8.1)

## 2023-05-27 LAB — LIPASE, BLOOD: Lipase: 55 U/L — ABNORMAL HIGH (ref 11–51)

## 2023-05-27 MED ORDER — KETOROLAC TROMETHAMINE 10 MG PO TABS: 10.0000 mg | ORAL_TABLET | Freq: Three times a day (TID) | ORAL | 0 refills | Status: AC | PRN

## 2023-05-27 MED ORDER — CEFTRIAXONE SODIUM 1 G IJ SOLR
1.0000 g | Freq: Once | INTRAMUSCULAR | Status: AC
  Administered 2023-05-27: 1 g via INTRAMUSCULAR
  Filled 2023-05-27: qty 10

## 2023-05-27 MED ORDER — KETOROLAC TROMETHAMINE 30 MG/ML IJ SOLN
30.0000 mg | Freq: Once | INTRAMUSCULAR | Status: AC
  Administered 2023-05-27: 30 mg via INTRAVENOUS
  Filled 2023-05-27: qty 1

## 2023-05-27 MED ORDER — CEPHALEXIN 500 MG PO CAPS: 500.0000 mg | ORAL_CAPSULE | Freq: Four times a day (QID) | ORAL | 0 refills | Status: AC

## 2023-05-27 MED ORDER — CEPHALEXIN 500 MG PO CAPS: 500.0000 mg | ORAL_CAPSULE | Freq: Two times a day (BID) | ORAL | 0 refills | Status: DC

## 2023-05-27 NOTE — ED Notes (Signed)
 Patient discharged home. VSS. Patient ambulatory out of treatment area with clean and steady gait. All questions answered at time of discharge. Belongings sent home with patient.

## 2023-05-27 NOTE — ED Provider Notes (Signed)
 Patient seen by previous provider.  See note for full HPI.  Here for evaluation of urinary frequency, urgency, lower abdominal pain and right flank pain.  She has a history of recurrent UTIs as well as prior kidney stones. Azo without relief.  On arrival she is afebrile, nonseptic, non-ill-appearing.  Urine does show infection here.  Patient was given Rocephin and Toradol with significant relief of her pain.  Plan to follow-up on his CT abdomen pelvis to ensure no stones.  Can DC home with Keflex if no abnormality on imaging Physical Exam  BP 105/69 (BP Location: Right Arm)   Pulse 82   Temp 98.3 F (36.8 C) (Oral)   Resp 16   Ht 5' 6.93" (1.7 m)   Wt 80 kg   SpO2 99%   BMI 27.68 kg/m   Physical Exam Vitals and nursing note reviewed.  Constitutional:      General: She is not in acute distress.    Appearance: She is well-developed. She is not ill-appearing or diaphoretic.  HENT:     Head: Atraumatic.  Eyes:     Pupils: Pupils are equal, round, and reactive to light.  Cardiovascular:     Rate and Rhythm: Normal rate and regular rhythm.  Pulmonary:     Effort: Pulmonary effort is normal. No respiratory distress.  Abdominal:     General: Bowel sounds are normal. There is no distension.     Palpations: Abdomen is soft.     Tenderness: There is abdominal tenderness in the suprapubic area. There is right CVA tenderness. There is no guarding or rebound. Negative signs include Murphy's sign, Rovsing's sign and McBurney's sign.  Musculoskeletal:        General: Normal range of motion.     Cervical back: Normal range of motion and neck supple.  Skin:    General: Skin is warm and dry.  Neurological:     General: No focal deficit present.     Mental Status: She is alert and oriented to person, place, and time.     Procedures  Procedures Labs Reviewed  LIPASE, BLOOD - Abnormal; Notable for the following components:      Result Value   Lipase 55 (*)    All other components within  normal limits  CBC - Abnormal; Notable for the following components:   RBC 5.54 (*)    All other components within normal limits  URINALYSIS, ROUTINE W REFLEX MICROSCOPIC - Abnormal; Notable for the following components:   Color, Urine AMBER (*)    Specific Gravity, Urine 1.003 (*)    Glucose, UA >=500 (*)    Hgb urine dipstick SMALL (*)    Nitrite POSITIVE (*)    Leukocytes,Ua LARGE (*)    Bacteria, UA RARE (*)    All other components within normal limits  URINE CULTURE  COMPREHENSIVE METABOLIC PANEL   CT ABDOMEN PELVIS WO CONTRAST Result Date: 05/27/2023 CLINICAL DATA:  Flank pain. EXAM: CT ABDOMEN AND PELVIS WITHOUT CONTRAST TECHNIQUE: Multidetector CT imaging of the abdomen and pelvis was performed following the standard protocol without IV contrast. RADIATION DOSE REDUCTION: This exam was performed according to the departmental dose-optimization program which includes automated exposure control, adjustment of the mA and/or kV according to patient size and/or use of iterative reconstruction technique. COMPARISON:  None Available. FINDINGS: Lower chest: No acute abnormality. Hepatobiliary: No focal liver abnormality is seen. No gallstones, gallbladder wall thickening, or biliary dilatation. Pancreas: Unremarkable. No pancreatic ductal dilatation or surrounding inflammatory changes. Spleen:  Normal in size without focal abnormality. Adrenals/Urinary Tract: Right adrenal gland is unremarkable. A 9 mm low-attenuation (approximately 19.14 Hounsfield units) left adrenal mass is seen (axial CT image 20, CT series 3). Kidneys are normal in size. A 1.6 cm cystic appearing structure is seen within the mid left kidney 1 mm and 3 mm nonobstructing renal calculi are seen within the right kidney. A 2 mm nonobstructing renal calculus is seen within the lower pole of the left kidney. Mild right-sided hydronephrosis and proximal hydroureter are seen without evidence of an obstructing renal calculus. Bladder is  unremarkable. Stomach/Bowel: Stomach is within normal limits. The proximal and midportion of the appendix is dilated and measures approximately 7.6 mm in diameter (axial CT images 51 through 57, CT series 3). There is no evidence of appendiceal wall thickening or periappendiceal inflammatory fat stranding. No evidence of bowel wall thickening, distention, or inflammatory changes. Vascular/Lymphatic: No significant vascular findings are present. No enlarged abdominal or pelvic lymph nodes. Reproductive: Status post hysterectomy. No adnexal masses. Other: No abdominal wall hernia or abnormality. No abdominopelvic ascites. Musculoskeletal: No acute or significant osseous findings. IMPRESSION: 1. Mild right-sided hydronephrosis and proximal hydroureter without evidence of an obstructing renal calculus. 2. Bilateral nonobstructing renal calculi. 3. Dilated proximal and midportion of the appendix without evidence of appendiceal wall thickening or periappendiceal inflammatory fat stranding. Correlation with follow-up abdomen pelvis CT is recommended if early appendicitis is of clinical concern. 4. 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 No follow-up imaging is recommended. This recommendation follows ACR consensus guidelines: Management of Incidental Adrenal Masses: A White Paper of the ACR Incidental Findings Committee. J Am Coll Radiol 2017;14:1038-1044. 5. Evidence of prior hysterectomy. Electronically Signed   By: Aram Candela M.D.   On: 05/27/2023 21:28    ED Course / MDM   Clinical Course as of 05/27/23 2236  Mon May 27, 2023  1510 Recurrent UTI, UTI here, recurrent kidney stone, FU on CT and disposition. Doesn't want IV. [BH]  2000 Patient reassessed. Still pending CT scan [BH]    Clinical Course User Index [BH] Quanta Roher A, PA-C   Patient seen by previous provider.  See note for full HPI.  Here for evaluation of urinary frequency, urgency, lower abdominal pain  and right flank pain.  She has a history of recurrent UTIs as well as prior kidney stones. Azo without relief.  On arrival she is afebrile, nonseptic, non-ill-appearing.  Urine does show infection here.  Patient was given Rocephin and Toradol with significant relief of her pain.  Plan to follow-up on his CT abdomen pelvis to ensure no stones.  Can DC home with Keflex if no abnormality on imaging   Labs and imaging personally viewed and interpreted:  CBC without leukocytosis Lipase 55 CMP wo abnormality UA positive for UTI CT ap shows perinephric stranding on the right, hydroureter without obstructing stone does show dilated appendix without inflammatory changes, correlate with exam.  Adrenal mass  Patient reassessed.  We discussed her CT scan.  She has no focal right lower quadrant pain.  I suspect her symptoms are likely related to pyelonephritis.  I did discuss radiology back with repeat CT scan with contrast however patient declined.  She has been here in the emergency department for 11 hours and does not want to wait any longer.  We also discussed her incidental adrenal mass which she is already aware of.  Patient is nontoxic, nonseptic appearing, in no apparent distress.  Patient's pain  and other symptoms adequately managed in emergency department.  Fluid bolus given.  Labs, imaging and vitals reviewed.  Patient does not meet the SIRS or Sepsis criteria.  On repeat exam patient does not have a surgical abdomin and there are no peritoneal signs.  No indication of appendicitis, bowel obstruction, bowel perforation, cholecystitis, diverticulitis, PID, TOA, intermittent/persistent torsion or ectopic pregnancy.  Patient discharged home with symptomatic treatment and given strict instructions for follow-up with their primary care physician.  I have also discussed reasons to return immediately to the ER.  Patient expresses understanding and agrees with plan.      Medical Decision Making Amount  and/or Complexity of Data Reviewed Independent Historian: spouse External Data Reviewed: labs, radiology and notes. Labs: ordered. Decision-making details documented in ED Course. Radiology: ordered and independent interpretation performed. Decision-making details documented in ED Course.  Risk OTC drugs. Prescription drug management. Parenteral controlled substances. Decision regarding hospitalization.         Lilymae Swiech A, PA-C 05/27/23 2236    Rozelle Logan, DO 05/27/23 2322

## 2023-05-27 NOTE — ED Notes (Addendum)
 Pt refusing IV wants non/con CT

## 2023-05-27 NOTE — ED Triage Notes (Signed)
 Pt came in via POV d/t last night having severe lower abd pain when she urinates. A/Ox4, rates her pain 10/10 during triage did try AZO OTC but denies any help with the abd pain, denies burning urination. Also reports upper bil leg pain &decreased appetite.

## 2023-05-27 NOTE — ED Notes (Signed)
 Pt comes to nurse's station and requests to speak with EDP regarding CT results. Pt informed CT results not back yet. Message sent to EDP.

## 2023-05-27 NOTE — ED Provider Notes (Signed)
 [place] EMERGENCY DEPARTMENT AT [facility] Provider Note   CSN: [id number] Arrival date & time: 05/27/23  1046     History  Chief Complaint  Patient presents with   Abdominal Pain   Increased Urination    Kristen Crawford is a 48 y.o. female with hx of ovarian cysts, HLD, asthma, who presents to the ED complaining of lower abdominal pain, urinary frequency and urgency, starting yesterday afternoon.  She moved here from [place] 3 years ago, has hx of recurrent UTI's, and prior surgery for kidney stones about 6-7 years ago. No fever. Some lower back pain. Tried azo without relief.    Abdominal Pain Associated symptoms: dysuria        Home Medications Prior to Admission medications   Medication Sig Start Date End Date Taking? Authorizing Provider  cephALEXin (KEFLEX) 500 MG capsule Take 1 capsule (500 mg total) by mouth 2 (two) times daily for 7 days. 05/27/23 06/03/23 Yes Peityn Payton T, PA-C  atorvastatin (LIPITOR) 10 MG tablet Take 10 mg by mouth daily.    [provider]  busPIRone (BUSPAR) 7.5 MG tablet Take 7.5 mg by mouth 3 (three) times daily.    [provider]  CRANBERRY PO Take 2 tablets by mouth daily.    [provider]  escitalopram (LEXAPRO) 10 MG tablet Take 10 mg by mouth daily.    [provider]  esomeprazole (NEXIUM) 20 MG capsule Take 20 mg by mouth daily.    [provider]  fosfomycin (MONUROL) 3 g PACK Take 3 g by mouth every 3 (three) days. 3 packs of 3g each. Patient not taking: Reported on 05/15/2022    [provider]  hydrochlorothiazide (HYDRODIURIL) 12.5 MG tablet Take 12.5 mg by mouth daily.    [provider]  levothyroxine (SYNTHROID) 75 MCG tablet Take 75 mcg by mouth daily.    [provider]  Liraglutide -Weight Management (SAXENDA) 18 MG/3ML SOPN Inject 0.6 mg daily and then increase by 0.6 mg every week until taking 3 mg daily (0.6 mg daily for 1  week, then 1.2 mg daily for 1 week, then 1.8 mg daily for 1 week, then 2.4 mg daily and then 3 mg daily for 1 week) 02/06/22     Probiotic Product (PROBIOTIC PO) Take 1 capsule by mouth daily.    [provider]  Semaglutide,0.25 or 0.5MG /DOS, (OZEMPIC, 0.25 OR 0.5 MG/DOSE,) 2 MG/3ML SOPN Inject 0.25 mg weekly for 4 weeks then increase to 0.5 mg weekly if tolerated 02/14/22         Allergies    Epinephrine and Orphenadrine    Review of Systems   Review of Systems  Gastrointestinal:  Positive for abdominal pain.  Genitourinary:  Positive for dysuria, frequency and urgency.  Musculoskeletal:  Positive for back pain.  All other systems reviewed and are negative.   Physical Exam Updated Vital Signs BP 102/65 (BP Location: Right Arm)   Pulse 85   Temp 98.4 F (36.9 C)   Resp 16   Ht 5' 6.93" (1.7 m)   Wt 80 kg   SpO2 97%   BMI 27.68 kg/m  Physical Exam Vitals and nursing note reviewed.  Constitutional:      Appearance: Normal appearance.  HENT:     Head: Normocephalic and atraumatic.  Eyes:     Conjunctiva/sclera: Conjunctivae normal.  Cardiovascular:     Rate and Rhythm: Normal rate and regular rhythm.  Pulmonary:  Effort: Pulmonary effort is normal. No respiratory distress.     Breath sounds: Normal breath sounds.  Abdominal:     General: There is no distension.     Palpations: Abdomen is soft.     Tenderness: There is abdominal tenderness in the suprapubic area.  Skin:    General: Skin is warm and dry.  Neurological:     General: No focal deficit present.     Mental Status: She is alert.     ED Results / Procedures / Treatments   Labs (all labs ordered are listed, but only abnormal results are displayed) Labs Reviewed  LIPASE, BLOOD - Abnormal; Notable for the following components:      Result Value   Lipase 55 (*)    All other components within normal limits  CBC - Abnormal; Notable for the following components:   RBC 5.54 (*)    All other  components within normal limits  URINALYSIS, ROUTINE W REFLEX MICROSCOPIC - Abnormal; Notable for the following components:   Color, Urine AMBER (*)    Specific Gravity, Urine 1.003 (*)    Glucose, UA >=500 (*)    Hgb urine dipstick SMALL (*)    Nitrite POSITIVE (*)    Leukocytes,Ua LARGE (*)    Bacteria, UA RARE (*)    All other components within normal limits  URINE CULTURE  COMPREHENSIVE METABOLIC PANEL    EKG None  Radiology No results found.  Procedures Procedures    Medications Ordered in ED Medications  cefTRIAXone (ROCEPHIN) injection 1 g (has no administration in time range)  ketorolac (TORADOL) 30 MG/ML injection 30 mg (30 mg Intravenous Given 05/27/23 1436)    ED Course/ Medical Decision Making/ A&P                                 Medical Decision Making Amount and/or Complexity of Data Reviewed Labs: ordered.  This patient is a 48 y.o. female  who presents to the ED for concern of lower abdominal pain. Hx recurrent UTI's.  Differential diagnoses prior to evaluation: The emergent differential diagnosis includes, but is not limited to,  PID, appendicitis, kidney stone, septic abortion, ruptured ovarian cyst, ovarian torsion, tubo-ovarian abscess, fibroids, endometriosis, diverticulitis, cystitis. This is not an exhaustive differential.   Past Medical History / Co-morbidities / Social History: ovarian cysts, HLD, asthma, full hysterectomy in 2010  Additional history: Chart reviewed. Pertinent results include: No prior urine cultures on file  Physical Exam: Physical exam performed. The pertinent findings include: Normal vital signs, no acute distress.  Abdomen soft with suprapubic tenderness to palpation, no guarding or rebound.  Lab Tests/Imaging studies: I personally interpreted labs/imaging and the pertinent results include: CBC and CMP normal.  UA with small hemoglobin, positive nitrites, large leukocytes, over 50 WBCs, and rare bacteria.  Lipase of 55.   Urine culture pending.  Will obtain CT to rule out septic stone.  Patient requested noncontrasted CT study.  CT abdomen pelvis without contrast pending at time of shift change.  Medications: I ordered medication including toradol, rocephin.  I have reviewed the patients home medicines and have made adjustments as needed.   Disposition: Patient discussed and care transferred to [facility] PA-C at shift change. Please see his/her note for further details regarding further ED course and disposition. Plan at time of handoff is follow up on CT study. If no obstructive stone, anticipate dc to home with treatment of UTI  with urine cultures pending.    Final Clinical Impression(s) / ED Diagnoses Final diagnoses:  Acute cystitis with hematuria    Rx / DC Orders ED Discharge Orders          Ordered    cephALEXin (KEFLEX) 500 MG capsule  2 times daily        05/27/23 1502           Portions of this report may have been transcribed using voice recognition software. Every effort was made to ensure accuracy; however, inadvertent computerized transcription errors may be present.    Jeanella Flattery 05/27/23 1503    Lonell Grandchild, MD 05/27/23 [phone]

## 2023-05-27 NOTE — ED Notes (Signed)
 Patient advised not to eat until results of CT scan. Family member returned with food for the patient.

## 2023-05-27 NOTE — ED Notes (Signed)
 Patient transported to CT

## 2023-05-27 NOTE — Discharge Instructions (Addendum)
 You were seen in the ER for urinary symptoms.   Your urine sample showed a urinary infection, I have sent antibiotics to your pharmacy. We have sent your urine for culture, and if this results in the antibiotic you are on is not sufficient, you will receive a call from the hospital.  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  If you develop any pain to your right lower quadrant you need to return for further evaluation and management

## 2023-05-30 LAB — URINE CULTURE: Culture: >=100000 — AB

## 2023-05-31 ENCOUNTER — Telehealth (HOSPITAL_BASED_OUTPATIENT_CLINIC_OR_DEPARTMENT_OTHER): Payer: Self-pay | Admitting: *Deleted

## 2023-05-31 NOTE — Telephone Encounter (Signed)
 Post ED Visit - Positive Culture Follow-up  Culture report reviewed by antimicrobial stewardship pharmacist: [facility] [x]  Lennie Muckle, Pharm.D. []  Celedonio Miyamoto, [address].D., BCPS AQ-ID []  Garvin Fila, Pharm.D., BCPS []  Georgina Pillion, [address].D., BCPS []  [place], [address].D., BCPS, AAHIVP []  Estella Husk, Pharm.D., BCPS, AAHIVP []  Lysle Pearl, PharmD, BCPS []  Phillips Climes, PharmD, BCPS []  Agapito Games, PharmD, BCPS []  Verlan Friends, PharmD []  Mervyn Gay, PharmD, BCPS []  Vinnie Level, PharmD  [facility] []  Len Childs, PharmD []  Greer Pickerel, PharmD []  Adalberto Cole, PharmD []  Perlie Gold, Rph []  Lonell Face) Jean Rosenthal, PharmD []  Earl Many, PharmD []  Junita Push, PharmD []  Dorna Leitz, PharmD []  Terrilee Files, PharmD []  Lynann Beaver, PharmD []  Keturah Barre, PharmD []  Loralee Pacas, PharmD []  Bernadene Person, PharmD   Positive urine culture Treated with Cephalexin, organism sensitive to the same and no further patient follow-up is required at this time.  Bing Quarry 05/31/2023, 12:43 PM

## 2023-06-14 ENCOUNTER — Ambulatory Visit
Admission: EM | Admit: 2023-06-14 | Discharge: 2023-06-14 | Disposition: A | Discharge status: Home / Self Care | Attending: Family Medicine | Admitting: Family Medicine

## 2023-06-14 ENCOUNTER — Encounter: Payer: Self-pay | Admitting: *Deleted

## 2023-06-14 DIAGNOSIS — N760 Acute vaginitis: Secondary | ICD-10-CM | POA: Insufficient documentation

## 2023-06-14 DIAGNOSIS — R3915 Urgency of urination: Secondary | ICD-10-CM | POA: Insufficient documentation

## 2023-06-14 LAB — POCT URINALYSIS DIP (MANUAL ENTRY)
Bilirubin, UA: NEGATIVE
Blood, UA: NEGATIVE
Glucose, UA: NEGATIVE mg/dL
Ketones, POC UA: NEGATIVE mg/dL
Leukocytes, UA: NEGATIVE
Nitrite, UA: NEGATIVE
Protein Ur, POC: NEGATIVE mg/dL
Spec Grav, UA: 1.02 (ref 1.010–1.025)
Urobilinogen, UA: 0.2 U/dL
pH, UA: 6 (ref 5.0–8.0)

## 2023-06-14 MED ORDER — FLUCONAZOLE 150 MG PO TABS: 150.0000 mg | ORAL_TABLET | Freq: Every day | ORAL | 0 refills | Status: AC

## 2023-06-14 NOTE — Discharge Instructions (Addendum)
 The clinic will contact you with results of the vaginal swab and urine culture done today if positive.  Start Diflucan as prescribed.  Lots of rest and fluids.  Follow-up with your PCP if your symptoms do not improve.  Please go to the ER if you develop any worsening symptoms.  Hope you feel better soon!

## 2023-06-14 NOTE — ED Triage Notes (Signed)
 Patient states she had Pyelo 3/3 completed course of antibiotics, lower abdominal pain and dysuria returned today.

## 2023-06-14 NOTE — ED Provider Notes (Signed)
 [facility]    CSN: [id number] Arrival date & time: 06/14/23  1705      History   Chief Complaint Chief Complaint  Patient presents with   Abdominal Pain   Dysuria    HPI Kristen Crawford is a 48 y.o. female presents for vaginal discomfort and urinary urgency.  Patient was treated for pyelonephritis on 3/3 in the emergency room with Keflex x 10 days.  States her symptoms completely resolved until today she developed some vaginal irritation and some dysuria with suprapubic pressure.  She is concerned the infection has returned.  Denies any fevers, nausea/vomiting, flank pain or vaginal discharge.  No STD concern.  No OTC medications have been used since onset.  No other concerns at this time.   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      Home Medications    Prior to Admission medications   Medication Sig Start Date End Date Taking? Authorizing Provider  fluconazole (DIFLUCAN) 150 MG tablet Take 1 tablet (150 mg total) by mouth daily. Take 1 tablet today and you may repeat in 3 days if symptoms persist 06/14/23  Yes Radford Pax, NP  ZEPBOUND 5 MG/0.5ML Pen  05/17/23  Yes [provider]  atorvastatin (LIPITOR) 10 MG tablet Take 10 mg by mouth daily.    [provider]  busPIRone (BUSPAR) 7.5 MG tablet Take 7.5 mg by mouth 3 (three) times daily.    [provider]  CRANBERRY PO Take 2 tablets by mouth daily.    [provider]   escitalopram (LEXAPRO) 10 MG tablet Take 10 mg by mouth daily.    [provider]  esomeprazole (NEXIUM) 20 MG capsule Take 20 mg by mouth daily.    [provider]  fosfomycin (MONUROL) 3 g PACK Take 3 g by mouth every 3 (three) days. 3 packs of 3g each. Patient not taking: Reported on 05/15/2022    [provider]  hydrochlorothiazide (HYDRODIURIL) 12.5 MG tablet Take 12.5 mg by mouth daily.    [provider]  ketorolac (TORADOL) 10 MG tablet Take 1 tablet (10 mg total) by mouth every 8 (eight) hours as needed. 05/27/23   Henderly, Britni A, PA-C  levothyroxine (SYNTHROID) 75 MCG tablet Take 75 mcg by mouth daily.    [provider]  Liraglutide -Weight Management (SAXENDA) 18 MG/3ML SOPN Inject 0.6 mg daily and then increase by 0.6 mg every week until taking 3 mg daily (0.6 mg daily for 1 week, then 1.2 mg daily for 1 week, then 1.8 mg daily for 1 week, then 2.4 mg daily and then 3 mg daily for 1 week) 02/06/22     Probiotic Product (PROBIOTIC PO) Take 1 capsule by mouth daily.    [provider]  Semaglutide,0.25 or 0.5MG /DOS, (OZEMPIC, 0.25 OR 0.5 MG/DOSE,) 2 MG/3ML SOPN Inject  0.25 mg weekly for 4 weeks then increase to 0.5 mg weekly if tolerated 02/14/22       Family History Family History  Problem Relation Age of Onset   Hypertension Father    Testicular cancer Brother     Social History Social History   Tobacco Use   Smoking status: Never   Smokeless tobacco: Never  Vaping Use   Vaping status: Never Used  Substance Use Topics   Alcohol use: Not Currently   Drug use: Never     Allergies   Epinephrine and Orphenadrine   Review of Systems Review of Systems  Genitourinary:  Positive for dysuria.     Physical Exam Triage Vital Signs ED Triage Vitals  Encounter Vitals Group     BP 06/14/23 1724 101/64     Systolic BP Percentile --      Diastolic BP Percentile --      Pulse Rate 06/14/23 1724 69     Resp  06/14/23 1724 18     Temp 06/14/23 1724 97.7 F (36.5 C)     Temp Source 06/14/23 1724 Oral     SpO2 06/14/23 1724 97 %     Weight 06/14/23 1718 174 lb 2.6 oz (79 kg)     Height 06/14/23 1718 5' 6.93" (1.7 m)     Head Circumference --      Peak Flow --      Pain Score 06/14/23 1718 6     Pain Loc --      Pain Education --      Exclude from Growth Chart --    No data found.  Updated Vital Signs BP 101/64 (BP Location: Right Arm)   Pulse 69   Temp 97.7 F (36.5 C) (Oral)   Resp 18   Ht 5' 6.93" (1.7 m)   Wt 174 lb 2.6 oz (79 kg)   SpO2 97%   BMI 27.34 kg/m   Visual Acuity Right Eye Distance:   Left Eye Distance:   Bilateral Distance:    Right Eye Near:   Left Eye Near:    Bilateral Near:     Physical Exam Vitals and nursing note reviewed.  Constitutional:      General: She is not in acute distress.    Appearance: Normal appearance. She is not ill-appearing.  HENT:     Head: Normocephalic and atraumatic.  Eyes:     Pupils: Pupils are equal, round, and reactive to light.  Cardiovascular:     Rate and Rhythm: Normal rate.  Pulmonary:     Effort: Pulmonary effort is normal.  Abdominal:     Tenderness: There is no right CVA tenderness or left CVA tenderness.  Skin:    General: Skin is warm and dry.  Neurological:     General: No focal deficit present.     Mental Status: She is alert and oriented to person, place, and time.  Psychiatric:        Mood and Affect: Mood normal.        Behavior: Behavior normal.      UC Treatments / Results  Labs (all labs ordered are listed, but only abnormal results are displayed) Labs Reviewed  URINE CULTURE  POCT URINALYSIS DIP (MANUAL ENTRY)  CERVICOVAGINAL ANCILLARY ONLY    EKG   Radiology No results found.  Procedures Procedures (including critical care time)  Medications Ordered in UC Medications - No data to display  Initial Impression / Assessment and Plan / UC Course  I have reviewed the triage vital  signs and the nursing notes.  Pertinent labs & imaging results that were available during my care of the patient were reviewed by me and considered in my medical decision making (see chart for details).     Reviewed exam and symptoms with patient.  No red flags.  Urine negative, will culture given recent [place] as well as her symptoms.  Patient also did a vaginal swab to check for BV and yeast will contact for any positive results once it is available.  Given her recent antibiotic use and vaginal irritation will start Diflucan as prescribed.  Advised PCP or GYN follow-up if symptoms do not improve.  ER precautions reviewed. Final Clinical Impressions(s) / UC Diagnoses   Final diagnoses:  Acute vaginitis  Urinary urgency     Discharge Instructions      The clinic will contact you with results of the vaginal swab and urine culture done today if positive.  Start Diflucan as prescribed.  Lots of rest and fluids.  Follow-up with your PCP if your symptoms do not improve.  Please go to the ER if you develop any worsening symptoms.  Hope you feel better soon!   ED Prescriptions     Medication Sig Dispense Auth. Provider   fluconazole (DIFLUCAN) 150 MG tablet Take 1 tablet (150 mg total) by mouth daily. Take 1 tablet today and you may repeat in 3 days if symptoms persist 2 tablet Radford Pax, NP      PDMP not reviewed this encounter.   Radford Pax, NP 06/14/23 [phone]

## 2023-06-17 LAB — CERVICOVAGINAL ANCILLARY ONLY
Bacterial Vaginitis (gardnerella): NEGATIVE
Candida Glabrata: NEGATIVE
Candida Vaginitis: NEGATIVE
Comment: NEGATIVE
Comment: NEGATIVE
Comment: NEGATIVE

## 2023-06-17 LAB — URINE CULTURE

## 2023-06-18 ENCOUNTER — Telehealth: Payer: Self-pay

## 2023-06-18 MED ORDER — SULFAMETHOXAZOLE-TRIMETHOPRIM 800-160 MG PO TABS
1.0000 | ORAL_TABLET | Freq: Two times a day (BID) | ORAL | 0 refills | Status: AC
Start: 2023-06-18 — End: 2023-06-21

## 2023-06-18 NOTE — Telephone Encounter (Signed)
 Per Helaine Chess,  PA-C, " Please provide patient with a 3-day course of Bactrim DS twice daily." Attempted to reach patient x1 using interpreter line. VM full.  Rx sent to pharmacy on file.

## 2023-06-24 ENCOUNTER — Ambulatory Visit
Admission: RE | Admit: 2023-06-24 | Discharge: 2023-06-24 | Disposition: A | Discharge status: Home / Self Care | Payer: Self-pay | Source: Ambulatory Visit | Attending: Physician Assistant | Admitting: Physician Assistant

## 2023-06-24 DIAGNOSIS — N63 Unspecified lump in unspecified breast: Secondary | ICD-10-CM

## 2023-10-11 ENCOUNTER — Ambulatory Visit: Admitting: Family Medicine

## 2024-03-02 ENCOUNTER — Other Ambulatory Visit: Payer: Self-pay

## 2024-04-06 ENCOUNTER — Other Ambulatory Visit: Payer: Self-pay | Admitting: Obstetrics and Gynecology

## 2024-04-06 DIAGNOSIS — Z1231 Encounter for screening mammogram for malignant neoplasm of breast: Secondary | ICD-10-CM

## 2024-06-24 ENCOUNTER — Ambulatory Visit
